# Patient Record
Sex: Female | Born: 1995 | Race: Black or African American | Hispanic: No | Marital: Single | Smoking: Never smoker
Health system: Southern US, Community
[De-identification: ages and names within clinical notes are randomized; demographics above are authoritative.]

---

## 2013-05-13 ENCOUNTER — Ambulatory Visit (INDEPENDENT_AMBULATORY_CARE_PROVIDER_SITE_OTHER): Payer: Medicaid Other | Admitting: Sports Medicine

## 2013-05-13 ENCOUNTER — Encounter: Payer: Self-pay | Admitting: Sports Medicine

## 2013-05-13 ENCOUNTER — Ambulatory Visit (INDEPENDENT_AMBULATORY_CARE_PROVIDER_SITE_OTHER): Payer: Medicaid Other

## 2013-05-13 VITALS — BP 114/70 | HR 91 | Wt 140.0 lb

## 2013-05-13 DIAGNOSIS — M5412 Radiculopathy, cervical region: Secondary | ICD-10-CM

## 2013-05-13 DIAGNOSIS — G5600 Carpal tunnel syndrome, unspecified upper limb: Secondary | ICD-10-CM

## 2013-05-13 DIAGNOSIS — G5620 Lesion of ulnar nerve, unspecified upper limb: Secondary | ICD-10-CM

## 2013-05-13 DIAGNOSIS — M542 Cervicalgia: Secondary | ICD-10-CM | POA: Insufficient documentation

## 2013-05-13 MED ORDER — MELOXICAM 15 MG PO TABS: ORAL_TABLET | ORAL | Status: DC

## 2013-05-13 MED ORDER — GABAPENTIN 300 MG PO CAPS: ORAL_CAPSULE | ORAL | Status: DC

## 2013-05-13 MED ORDER — PREDNISONE 50 MG PO TABS: ORAL_TABLET | ORAL | Status: DC

## 2013-05-13 NOTE — Assessment & Plan Note (Signed)
Nighttime splints of the elbow. Mobic, gabapentin, we will also evaluate the cervical spine considering her neck symptoms.

## 2013-05-13 NOTE — Progress Notes (Signed)
   Subjective:    I'm seeing this patient as a consultation for:  Dr. Everlena Cooper  CC: Hand pain  HPI: This is a very pleasant 17 year old female who, for the past one year has noted pain, and occasional numbness in both hands, numbness predominantly occurs in the fourth and fifth digits when waking up, pain occurs at the proximal palmar crease, extending into the thenar eminence and occurs when using her hands excessively. She feels as though these 2 symptoms are separate. She also has some pain in her neck. Denies any bowel or bladder dysfunction, no saddle anesthesia. She saw a neurologist to perform nerve conduction study which was negative. She does have nighttime splints for her wrists does not wear this. Symptoms are moderate, persistent.  Past medical history, Surgical history, Family history not pertinant except as noted below, Social history, Allergies, and medications have been entered into the medical record, reviewed, and no changes needed.   Review of Systems: No headache, visual changes, nausea, vomiting, diarrhea, constipation, dizziness, abdominal pain, skin rash, fevers, chills, night sweats, weight loss, swollen lymph nodes, body aches, joint swelling, muscle aches, chest pain, shortness of breath, mood changes, visual or auditory hallucinations.   Objective:   General: Well Developed, well nourished, and in no acute distress.  Neuro/Psych: Alert and oriented x3, extra-ocular muscles intact, able to move all 4 extremities, sensation grossly intact. Skin: Warm and dry, no rashes noted.  Respiratory: Not using accessory muscles, speaking in full sentences, trachea midline.  Cardiovascular: Pulses palpable, no extremity edema. Abdomen: Does not appear distended. Bilateral Wrist: Inspection normal with no visible erythema or swelling. ROM smooth and normal with good flexion and extension and ulnar/radial deviation that is symmetrical with opposite wrist. Mild tenderness to  palpation over the proximal palmar crease. No snuffbox tenderness. No tenderness over Canal of Guyon. Strength 5/5 in all directions without pain. Negative Finkelstein, tinel's and phalens. Negative Watson's test. Bilateral Elbow: Unremarkable to inspection. Range of motion full pronation, supination, flexion, extension. Strength is full to all of the above directions Stable to varus, valgus stress. Negative moving valgus stress test. No discrete areas of tenderness to palpation. Ulnar nerve does sublux mildly. Negative cubital tunnel Tinel's. Neck: Inspection unremarkable. No palpable stepoffs. Positive Spurling's maneuver bilaterally with reproduction of radicular symptoms. Full neck range of motion Grip strength and sensation normal in bilateral hands Strength good C4 to T1 distribution No sensory change to C4 to T1 Negative Hoffman sign bilaterally Reflexes normal  Cervical spine x-rays reviewed and are negative with the exception of straightening of the normal cervical lordosis.  Impression and Recommendations:   This case required medical decision making of moderate complexity.

## 2013-05-13 NOTE — Assessment & Plan Note (Signed)
Prednisone, Mobic, gabapentin. X-rays. 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

## 2013-05-13 NOTE — Assessment & Plan Note (Addendum)
Continue night splints, rehabilitation exercises, Mobic, gabapentin. We also need to evaluate the cervical spine with bilateral symptoms and neck pain. Return in one month, median nerve hydrodissection if no better.

## 2013-05-20 ENCOUNTER — Encounter: Payer: Self-pay | Admitting: Sports Medicine

## 2013-06-09 ENCOUNTER — Encounter: Payer: Self-pay | Admitting: Sports Medicine

## 2013-06-09 ENCOUNTER — Ambulatory Visit (INDEPENDENT_AMBULATORY_CARE_PROVIDER_SITE_OTHER): Payer: Medicaid Other | Admitting: Sports Medicine

## 2013-06-09 VITALS — BP 115/69 | HR 99 | Wt 138.0 lb

## 2013-06-09 DIAGNOSIS — M542 Cervicalgia: Secondary | ICD-10-CM

## 2013-06-09 NOTE — Progress Notes (Signed)
  Subjective:    CC: Followup  HPI: Neck pain: Improved slightly with prednisone, Mobic, did not do any home exercises. Still has some numbness and tingling that radiates down both arms it C8 type distribution, I placed her in bilateral wrist splints she did have some carpal tunnel symptoms, her pain did improve slightly. I also placed her night splints for extension and she was also endorsing some pain in cubital tunnel syndrome type distribution. Overall she's not much better. Of note she did have nerve conduction and electromyography that was negative.  Past medical history, Surgical history, Family history not pertinant except as noted below, Social history, Allergies, and medications have been entered into the medical record, reviewed, and no changes needed.   Review of Systems: No fevers, chills, night sweats, weight loss, chest pain, or shortness of breath.   Objective:    General: Well Developed, well nourished, and in no acute distress.  Neuro: Alert and oriented x3, extra-ocular muscles intact, sensation grossly intact.  HEENT: Normocephalic, atraumatic, pupils equal round reactive to light, neck supple, no masses, no lymphadenopathy, thyroid nonpalpable.  Skin: Warm and dry, no rashes. Cardiac: Regular rate and rhythm, no murmurs rubs or gallops, no lower extremity edema.  Respiratory: Clear to auscultation bilaterally. Not using accessory muscles, speaking in full sentences.  Impression and Recommendations:

## 2013-06-09 NOTE — Assessment & Plan Note (Signed)
Overall improved with prednisone, but then worsened again. At this point I'm not exactly sure as to where her symptoms are originating from, with bilateral C8 type symptoms as well as neck pain and straightening of the cervical spine on x-rays we are going to obtain an MRI of her neck. Home exercises. She did have negative nerve conduction test in the recent past, and they will obtain results for me. Some symptoms do resolve carpal tunnel, and she also noted some improvement with wrist braces at night. No improvement with elbow extension splinting at night. She will see me back to go over results of the cervical spine MRI, no better we can start to consider carpal tunnel injections, as well as the possibility of starting amitriptyline.

## 2013-06-13 ENCOUNTER — Telehealth: Payer: Self-pay

## 2013-06-13 NOTE — Telephone Encounter (Signed)
Called medicaid at [phone] to obtain PA for MRI Cervical Spine. Case # [id number]. Waiting for a fax or phone call to confirm approval.

## 2013-06-16 ENCOUNTER — Telehealth: Payer: Self-pay

## 2013-06-16 NOTE — Telephone Encounter (Signed)
Checked the fax machine and vm and as of now I still have not heard anything about the approval for MRI.

## 2013-06-17 ENCOUNTER — Telehealth: Payer: Self-pay

## 2013-06-17 NOTE — Telephone Encounter (Signed)
MRI Cervical spine approved PA# [id number]

## 2013-06-19 ENCOUNTER — Telehealth: Payer: Self-pay | Admitting: *Deleted

## 2013-06-19 MED ORDER — DIAZEPAM 5 MG PO TABS: ORAL_TABLET | ORAL | Status: DC

## 2013-06-19 NOTE — Telephone Encounter (Signed)
Tried to call mom but vm not set up. Faxed rx to [facility]

## 2013-06-19 NOTE — Telephone Encounter (Signed)
Joann Glover, Rx placed in in-box ready for pickup/faxing.  

## 2013-06-19 NOTE — Telephone Encounter (Signed)
Joann Glover called mom to schedule this patient for her MRI and it is scheduled for 8/26 and pt is claustrophobic and will need something to take prior to scan.

## 2013-06-24 ENCOUNTER — Ambulatory Visit (INDEPENDENT_AMBULATORY_CARE_PROVIDER_SITE_OTHER): Payer: Medicaid Other

## 2013-06-24 DIAGNOSIS — R209 Unspecified disturbances of skin sensation: Secondary | ICD-10-CM

## 2013-06-24 DIAGNOSIS — M542 Cervicalgia: Secondary | ICD-10-CM

## 2013-07-10 ENCOUNTER — Ambulatory Visit: Payer: Medicaid Other | Admitting: Sports Medicine

## 2013-07-11 ENCOUNTER — Ambulatory Visit (INDEPENDENT_AMBULATORY_CARE_PROVIDER_SITE_OTHER): Payer: Medicaid Other | Admitting: Sports Medicine

## 2013-07-11 ENCOUNTER — Encounter: Payer: Self-pay | Admitting: Sports Medicine

## 2013-07-11 VITALS — BP 108/71 | HR 90 | Wt 136.0 lb

## 2013-07-11 DIAGNOSIS — M542 Cervicalgia: Secondary | ICD-10-CM

## 2013-07-11 MED ORDER — AMITRIPTYLINE HCL 50 MG PO TABS: ORAL_TABLET | ORAL | Status: DC

## 2013-07-11 NOTE — Assessment & Plan Note (Signed)
Symptoms continued to be fairly elusive with a negative cervical spine MRI with the exception of straightening of the lordosis. Nerve conduction studies which were negative. Continued pain in the neck with bilateral C6 tight radicular symptoms. She did not respond to night splinting of her wrists. He does not look classically like carpal tunnel syndrome either. At this point we are going to start amitriptyline at bedtime, and it like to see her back in a month. They are going to still try and get me nerve conduction study results.

## 2013-07-11 NOTE — Progress Notes (Signed)
  Subjective:    CC: Follow up  HPI: Joann Glover returns to see me, she said very elusive symptoms for several months now, she has neck pain, with radiation down both arms and to the thumb and forefinger bilaterally. Pain is worse with neck flexion, and when sleeping. She also had some paresthesias into the fourth and fifth fingers bilaterally. We tried bilateral extension splinting to treat cubital tunnel syndrome which was ineffective, we also try bilateral wrist splinting at night which was also ineffective and directed at possible carpal tunnel syndrome. She also had a nerve conduction study which was performed after greater than 6 weeks of symptoms, this was negative. Ultimately we performed an MRI of her cervical spine which was negative with the exception of straightening of the normal cervical lordosis. Unfortunate symptoms are worsening, persistent. No bowel or bladder dysfunction, no saddle numbness, no constitutional symptoms. She has had insufficient response to NSAIDs. No response to steroids. Overall she reports that her mood is good.  Past medical history, Surgical history, Family history not pertinant except as noted below, Social history, Allergies, and medications have been entered into the medical record, reviewed, and no changes needed.   Review of Systems: No fevers, chills, night sweats, weight loss, chest pain, or shortness of breath.   Objective:    General: Well Developed, well nourished, and in no acute distress.  Neuro: Alert and oriented x3, extra-ocular muscles intact, sensation grossly intact.  HEENT: Normocephalic, atraumatic, pupils equal round reactive to light, neck supple, no masses, no lymphadenopathy, thyroid nonpalpable.  Skin: Warm and dry, no rashes. Cardiac: Regular rate and rhythm, no murmurs rubs or gallops, no lower extremity edema.  Respiratory: Clear to auscultation bilaterally. Not using accessory muscles, speaking in full sentences. Neck: Inspection  unremarkable. No palpable stepoffs. Negative Spurling's maneuver. Full neck range of motion Grip strength and sensation normal in bilateral hands Strength good C4 to T1 distribution No sensory change to C4 to T1 Negative Hoffman sign bilaterally Reflexes normal  Cervical spine MRI is completely negative, discs and facet joints look perfect, there is some mild straightening of the normal cervical lordosis.  Impression and Recommendations:

## 2013-11-14 ENCOUNTER — Telehealth: Payer: Self-pay

## 2013-11-14 DIAGNOSIS — M542 Cervicalgia: Secondary | ICD-10-CM

## 2013-11-14 MED ORDER — AMITRIPTYLINE HCL 50 MG PO TABS: 50.0000 mg | ORAL_TABLET | Freq: Every day | ORAL | Status: AC

## 2013-11-14 NOTE — Telephone Encounter (Signed)
Done

## 2013-11-14 NOTE — Telephone Encounter (Signed)
Patient request refill for Amitriptyline 50 mg. Please advise [facility]. Rhonda Cunningham,CMA

## 2016-04-26 ENCOUNTER — Encounter (HOSPITAL_BASED_OUTPATIENT_CLINIC_OR_DEPARTMENT_OTHER): Payer: Self-pay | Admitting: *Deleted

## 2016-04-26 ENCOUNTER — Emergency Department (HOSPITAL_BASED_OUTPATIENT_CLINIC_OR_DEPARTMENT_OTHER): Payer: Self-pay

## 2016-04-26 ENCOUNTER — Emergency Department (HOSPITAL_BASED_OUTPATIENT_CLINIC_OR_DEPARTMENT_OTHER)
Admission: EM | Admit: 2016-04-26 | Discharge: 2016-04-26 | Disposition: A | Discharge status: Home / Self Care | Payer: Self-pay | Attending: Emergency Medicine | Admitting: Emergency Medicine

## 2016-04-26 DIAGNOSIS — M542 Cervicalgia: Secondary | ICD-10-CM | POA: Insufficient documentation

## 2016-04-26 DIAGNOSIS — Z79899 Other long term (current) drug therapy: Secondary | ICD-10-CM | POA: Insufficient documentation

## 2016-04-26 LAB — PREGNANCY, URINE: Preg Test, Ur: NEGATIVE

## 2016-04-26 NOTE — ED Notes (Signed)
Pt given instructions as per chart.Verbalizes understanding. No questions. 

## 2016-04-26 NOTE — Discharge Instructions (Signed)
Please follow-up with specialists as indicated. Please use ice, ibuprofen as needed. Please return immediately if any new or worsening signs or symptoms present.

## 2016-04-26 NOTE — ED Notes (Signed)
States she was at work on 6-21 and went up 5 steps on ladder and missed last 2 steps and landed on feet and felt sharp pain in base of neck and shoulder blades. Was seen at [facility] on day of accident.  Pt states pain is not as severe as it was but she still hurts

## 2016-04-26 NOTE — ED Provider Notes (Signed)
CSN: [id number]     Arrival date & time 04/26/16  1057 History   First MD Initiated Contact with Patient 04/26/16 1115     Chief Complaint  Patient presents with  . Neck Pain    HPI  20 year old female presents today with complaints of neck pain. Patient reports that on 04/19/2016 approximately 7 days ago she was on a ladder and slipped down approximately 2 rungs landing on her feet. She reports immediate pain in the cervical neck. Worse with range of motion, no pain with compression. Patient denies any upper or lower extremity sensory, strength deficits. Patient was seen by primary care with plain films of the cervical spine with no significant findings. She notes that since that time she has continued to have pain, and has mildly improved but is still severe nature. She reports pain medication intermittently helps. No history of the same.  History reviewed. No pertinent past medical history. History reviewed. No pertinent past surgical history. No family history on file. Social History  Substance Use Topics  . Smoking status: Never Smoker   . Smokeless tobacco: None  . Alcohol Use: None   OB History    No data available     Review of Systems  All other systems reviewed and are negative.   Allergies  Review of patient's allergies indicates no known allergies.  Home Medications   Prior to Admission medications   Medication Sig Start Date End Date Taking? Authorizing Provider  amitriptyline (ELAVIL) 50 MG tablet Take 1 tablet (50 mg total) by mouth at bedtime. 11/14/13  Yes Monica Bectonhomas J Thekkekandam, MD   BP 124/81 mmHg  Pulse 104  Temp(Src) 98.8 F (37.1 C) (Oral)  Resp 20  Ht 5\' 5"  (1.651 m)  Wt 64.864 kg  BMI 23.80 kg/m2  SpO2 100%  LMP 04/24/2016   Physical Exam  Constitutional: She is oriented to person, place, and time. She appears well-developed and well-nourished.  HENT:  Head: Normocephalic and atraumatic.  Eyes: Conjunctivae are normal. Pupils are equal, round,  and reactive to light. Right eye exhibits no discharge. Left eye exhibits no discharge. No scleral icterus.  Neck: Normal range of motion. No JVD present. No tracheal deviation present.  Pulmonary/Chest: Effort normal. No stridor.  Musculoskeletal:  Tenderness to palpation of the lower cervical and upper thoracic spinous process and surrounding soft tissues. Pain with flexion and extension of the neck, lateral flexion. No pain or neurological symptoms with axial loading of the spine. Full upper and lower extremity strength 5 out of 5, sensation grossly intact, no neurological deficits  Neurological: She is alert and oriented to person, place, and time. Coordination normal.  Skin: Skin is warm and dry. No rash noted. No erythema. No pallor.  Psychiatric: She has a normal mood and affect. Her behavior is normal. Judgment and thought content normal.  Nursing note and vitals reviewed.   ED Course  Procedures (including critical care time) Labs Review Labs Reviewed  PREGNANCY, URINE    Imaging Review Ct Cervical Spine Wo Contrast  04/26/2016  CLINICAL DATA:  Larey SeatFell down ladder 04/19/2016. Bilateral neck pain radiating into both shoulders. EXAM: CT CERVICAL SPINE WITHOUT CONTRAST TECHNIQUE: Multidetector CT imaging of the cervical spine was performed without intravenous contrast. Multiplanar CT image reconstructions were also generated. COMPARISON:  MRI 06/24/2013 FINDINGS: Loss of normal cervical lordosis which is stable since prior MRI. Prevertebral soft tissues are normal. Alignment is normal. No fracture or subluxation. No epidural or paraspinal hematoma. IMPRESSION: Cervical straightening, stable  since prior MRI. No acute bony abnormality. Electronically Signed   By: Charlett NoseKevin  Dover M.D.   On: 04/26/2016 12:50   Ct Thoracic Spine Wo Contrast  04/26/2016  CLINICAL DATA:  Fall down ladder 04/19/2016. Neck and upper back pain. EXAM: CT THORACIC SPINE WITHOUT CONTRAST TECHNIQUE: Multidetector CT imaging  of the thoracic spine was performed without intravenous contrast administration. Multiplanar CT image reconstructions were also generated. COMPARISON:  None. FINDINGS: Normal alignment. No fracture. Disc spaces are maintained. Visualized posterior ribs are intact. Visualized lungs are clear. IMPRESSION: No bony abnormality. Electronically Signed   By: Charlett NoseKevin  Dover M.D.   On: 04/26/2016 12:52   I have personally reviewed and evaluated these images and lab results as part of my medical decision-making.   EKG Interpretation None      MDM   Final diagnoses:  Neck pain    Labs:  Imaging:  Consults:  Therapeutics:  Discharge Meds:   Assessment/Plan: 20 year old female presents today with complaints of neck pain status post fall.Patient has no neurological deficits, she does have tenderness to palpation. She had negative plain films of the cervical spine. Patient will receive CT of her cervical and thoracic spine as she has tenderness, continued pain, and concern for underlying pathology.  CT shows no acute findings. Patient will be given work note for 2 days, neurosurgery follow-up, and strict return precautions. Both patient and her mother verbalized understanding and agreement to today's plan had no further questions or concerns at time of discharge        Eyvonne MechanicJeffrey Nicolae Vasek, PA-C 04/26/16 1308  Leta BaptistEmily Roe Nguyen, MD 04/29/16 [phone]

## 2017-12-04 IMAGING — CT CT CERVICAL SPINE W/O CM
3 of 4 series · 12 of 33 positions shown, 14 images · non-contrast
Comparison: MRI 06/24/2013

CLINICAL DATA: Fell down ladder 04/19/2016. Bilateral neck pain
radiating into both shoulders.

EXAM:
CT CERVICAL SPINE WITHOUT CONTRAST
TECHNIQUE: Multidetector CT imaging of the cervical spine was performed without
intravenous contrast. Multiplanar CT image reconstructions were also
generated.

[Series 4: sagittal bone · sagittal · 0.27mm/px · 5 of 61 slices shown, 6 images]
[im 21/61  bone]
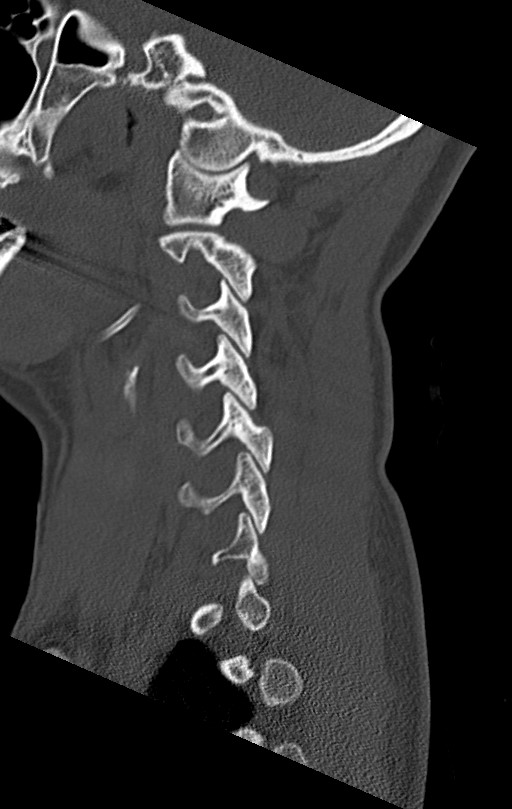
[im 26/61  bone]
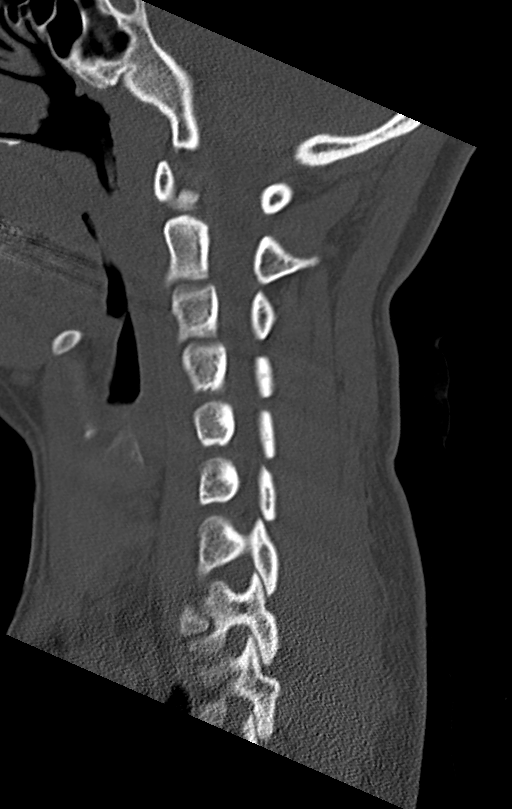
[im 31/61  soft-tissue]
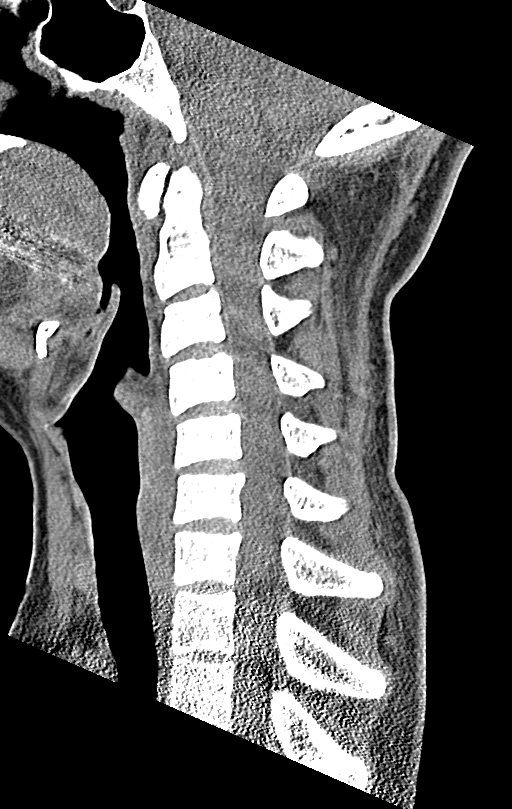
[im 31/61  bone]
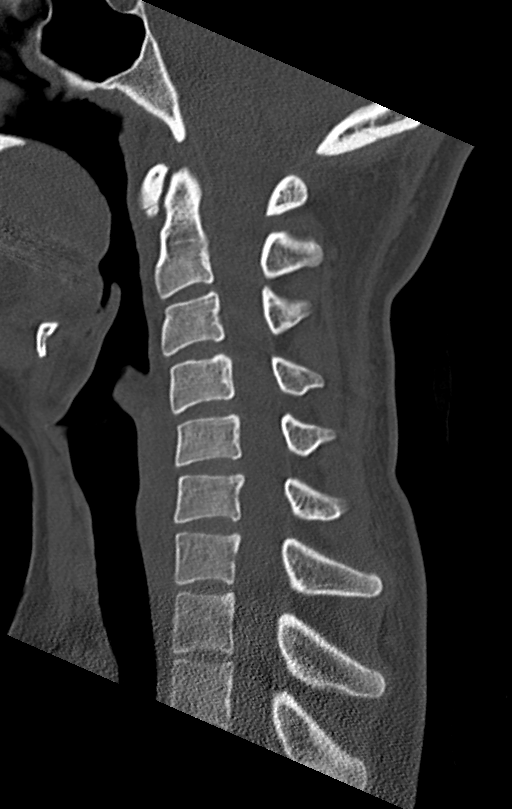
[im 36/61  bone]
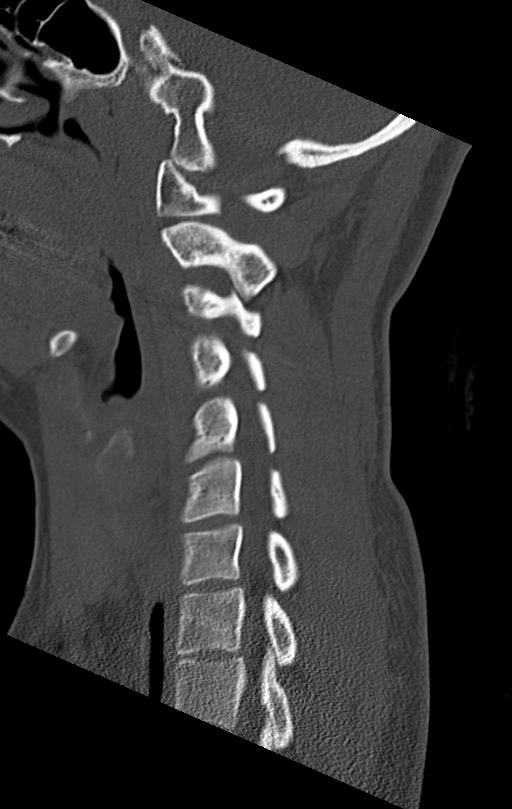
[im 41/61  bone]
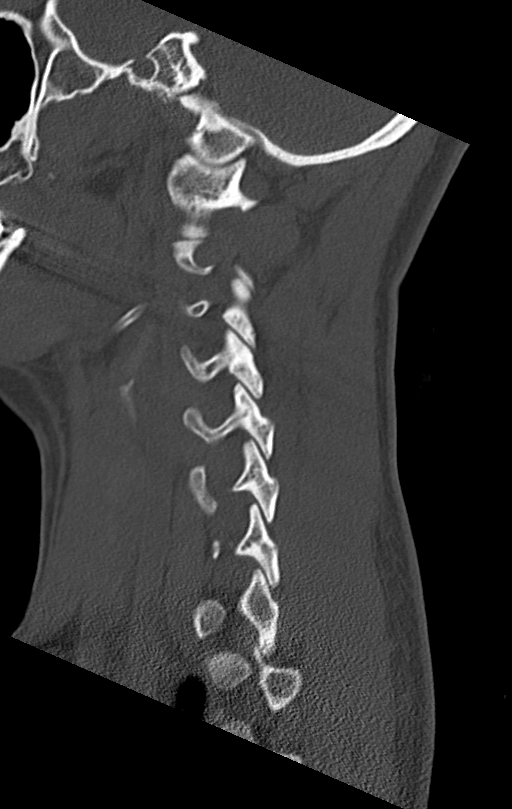

[Series 5: coronal bone · coronal · 0.29mm/px · 3 of 83 slices shown]
[im 23/83  bone]
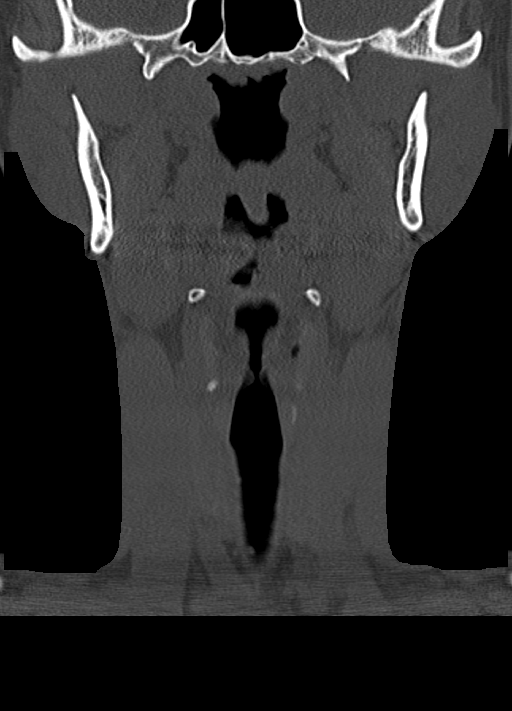
[im 35/83  bone]
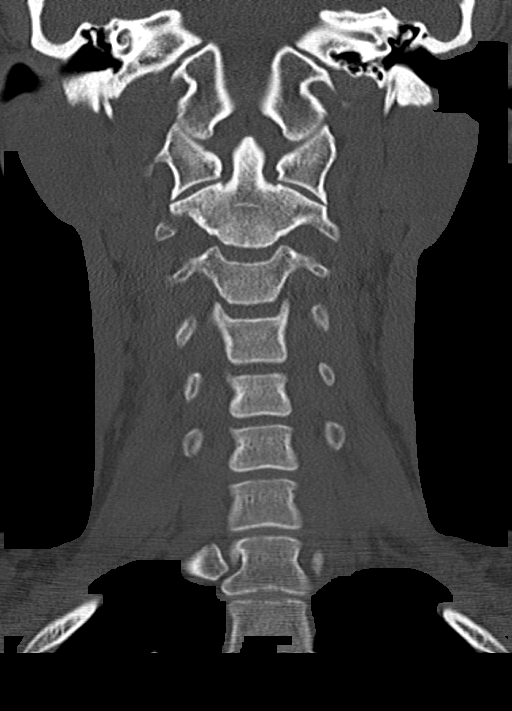
[im 48/83  bone]
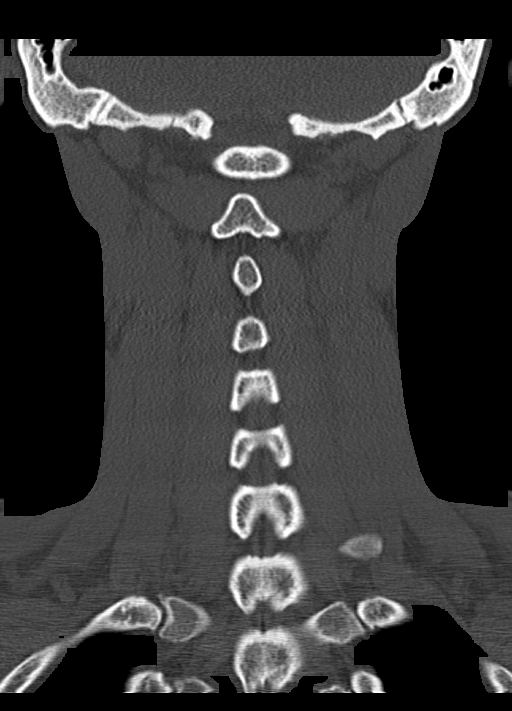

[Series 6: orthogonal bone · axial · 0.30mm/px · z∈[-300,-166]mm · 4 of 104 slices shown, 5 images]
[im 15/104  soft-tissue]
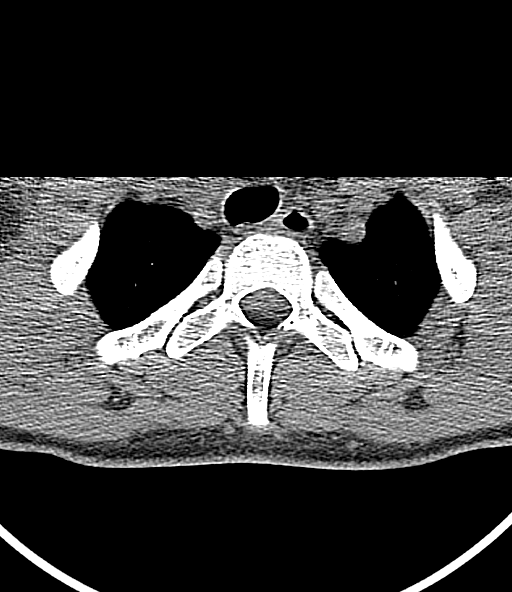
[im 15/104  bone]
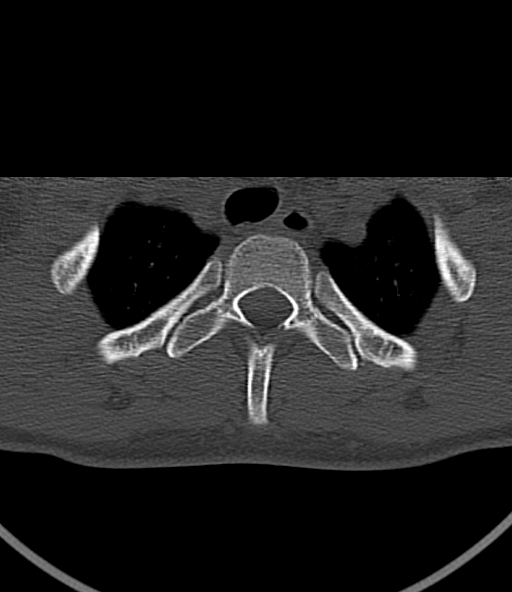
[im 45/104  bone]
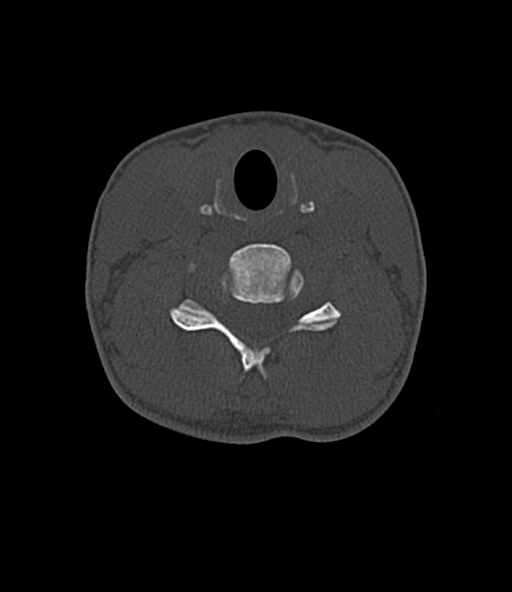
[im 59/104  bone]
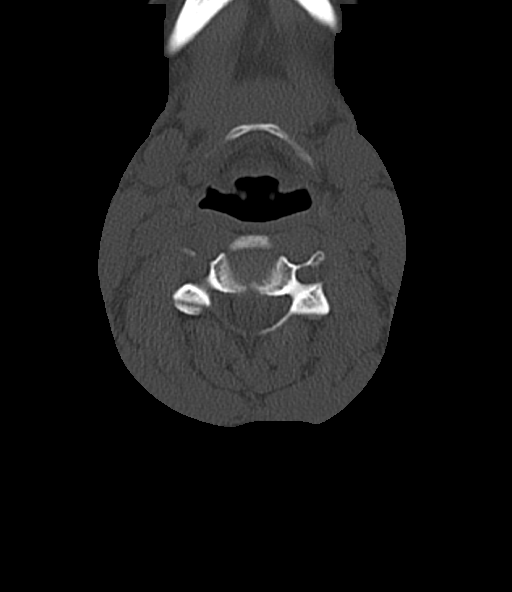
[im 89/104  bone]
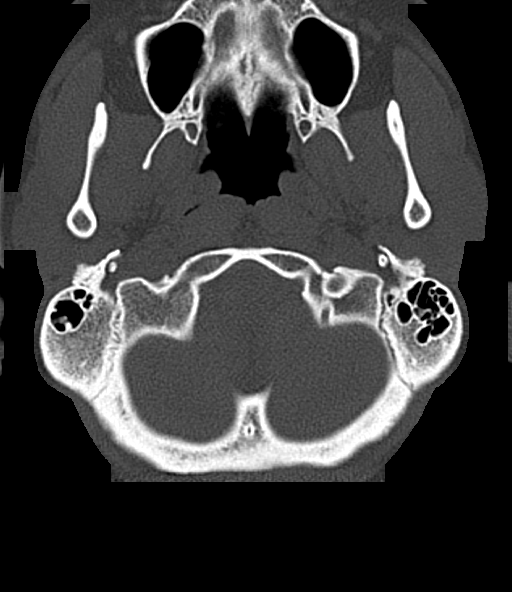

[12 of 33 positions shown; findings below may reference images not displayed]

FINDINGS: Loss of normal cervical lordosis which is stable since prior MRI.
Prevertebral soft tissues are normal. Alignment is normal. No
fracture or subluxation. No epidural or paraspinal hematoma.
IMPRESSION: Cervical straightening, stable since prior MRI.

No acute bony abnormality.
